# Patient Record
Sex: Male | Born: 1965 | Race: White | Hispanic: No | Marital: Married | Smoking: Light tobacco smoker
Health system: Southern US, Community
[De-identification: ages and names within clinical notes are randomized; demographics above are authoritative.]

## PROBLEM LIST (undated history)

## (undated) DIAGNOSIS — K219 Gastro-esophageal reflux disease without esophagitis: Secondary | ICD-10-CM

## (undated) DIAGNOSIS — T7840XA Allergy, unspecified, initial encounter: Secondary | ICD-10-CM

## (undated) DIAGNOSIS — F419 Anxiety disorder, unspecified: Secondary | ICD-10-CM

## (undated) DIAGNOSIS — E7800 Pure hypercholesterolemia, unspecified: Secondary | ICD-10-CM

## (undated) DIAGNOSIS — G4730 Sleep apnea, unspecified: Secondary | ICD-10-CM

## (undated) HISTORY — DX: Sleep apnea, unspecified: G47.30

## (undated) HISTORY — DX: Anxiety disorder, unspecified: F41.9

## (undated) HISTORY — DX: Pure hypercholesterolemia, unspecified: E78.00

## (undated) HISTORY — DX: Allergy, unspecified, initial encounter: T78.40XA

## (undated) HISTORY — PX: HERNIA REPAIR: SHX51

## (undated) HISTORY — DX: Gastro-esophageal reflux disease without esophagitis: K21.9

## (undated) HISTORY — PX: VASECTOMY: SHX75

---
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
## 1994-01-01 HISTORY — PX: HERNIA REPAIR: SHX51

## 2005-01-01 HISTORY — PX: VASECTOMY: SHX75

## 2005-03-16 ENCOUNTER — Ambulatory Visit (HOSPITAL_BASED_OUTPATIENT_CLINIC_OR_DEPARTMENT_OTHER): Admission: RE | Admit: 2005-03-16 | Discharge: 2005-03-16 | Payer: Self-pay | Admitting: Urology

## 2013-06-04 ENCOUNTER — Encounter (INDEPENDENT_AMBULATORY_CARE_PROVIDER_SITE_OTHER): Payer: Self-pay

## 2013-06-04 ENCOUNTER — Encounter (HOSPITAL_COMMUNITY): Payer: Self-pay | Admitting: Psychiatry

## 2013-06-04 ENCOUNTER — Ambulatory Visit (INDEPENDENT_AMBULATORY_CARE_PROVIDER_SITE_OTHER): Payer: 59 | Admitting: Psychiatry

## 2013-06-04 VITALS — BP 147/108 | HR 75 | Ht 74.0 in | Wt 232.0 lb

## 2013-06-04 DIAGNOSIS — F411 Generalized anxiety disorder: Secondary | ICD-10-CM | POA: Insufficient documentation

## 2013-06-04 MED ORDER — BUPROPION HCL ER (XL) 150 MG PO TB24: 150.0000 mg | ORAL_TABLET | ORAL | Status: DC

## 2013-06-04 NOTE — Progress Notes (Signed)
Psychiatric Assessment Adult  Patient Identification:  Richard ClarksJohn Zamora Date of Evaluation:  06/04/2013 Chief Complaint:  ADD History of Chief Complaint:   Chief Complaint  Patient presents with  . ADD    HPI Comments: Pt reports he thinks he has ADD. Even in school he was a poor student due to poor attention and lack of interest. Pt completed college and had to work hard to finish because his father was dying. Was told in school repeatedly that "Richard RuizJohn has the potential to do great if he would just focus". Later he got married and had a son, was in college and working a full time job and states it all helped him focus and be productive.  States he is easily distracted, starts multiple projects and it takes him months to finish them. While working he has periods of time where he can't take in any other information. During conversations he has trouble staying on topic. Currently at work he forces himself to pay attention to detail and doesn't procrastinate. He keeps a lot of notes and reminders on his calender. Desk is messy. Overall he works hard to be organized and sometimes he is scattered. Denies forgetfulness and losing items. Denies restlessness and figiting. Pt states he learned to be calm and pace himself as a child.   1.5yo ago he reconnected with his biological family and noticed a lot of the same symptoms. Pt spoke with his PCP who recommended he f/up with a psychiatrist for a f/up. States he is comfortable with himself and wonders if should try a stimulant. 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 Feels job performance is good and has never had any performance issues with management.  Overall he is doing well but wonders if he could be better.   Sleeping about 6-7 hrs/night. Energy is on the low side. Appetite is good.     Review of Systems  Constitutional: Negative.   HENT: Negative.   Eyes: Negative.   Respiratory: Negative.   Cardiovascular: Negative.    Gastrointestinal: Positive for abdominal distention.  Genitourinary: Negative.   Musculoskeletal: Negative.   Skin: Negative.   Neurological: Negative.   Psychiatric/Behavioral: Negative.    Physical Exam  Constitutional: He appears well-developed and well-nourished.  Psychiatric: He has a normal mood and affect. His speech is normal and behavior is normal. Judgment and thought content normal. Cognition and memory are normal.    Depressive Symptoms: denies depression, anhedonia, worthlessness/guilt. Denies SI/HI.   (Hypo) Manic Symptoms:   Elevated Mood:  No Irritable Mood:  No Grandiosity:  No Distractibility:  No Labiality of Mood:  No Delusions:  No Hallucinations:  No Impulsivity:  No Sexually Inappropriate Behavior:  No Financial Extravagance:  No Flight of Ideas:  No  Anxiety Symptoms: Excessive Worry:  Overall well controlled with Lexapro. Mom is dying so he is a little preoccupied right now.  Denies insomnia, HA, GI upset Panic Symptoms:  No Agoraphobia:  No Obsessive Compulsive: No  Symptoms: None, Specific Phobias:  No Social Anxiety:  No  Psychotic Symptoms:  Hallucinations: No None Delusions:  No Paranoia:  No   Ideas of Reference:  No  PTSD Symptoms: Ever had a traumatic exposure:  No Had a traumatic exposure in the last month:  No Re-experiencing: No None Hypervigilance:  No Hyperarousal: No None Avoidance: No None  Traumatic Brain Injury: No denies  Past Psychiatric History: Diagnosis: Anxiety  Hospitalizations: denies  Outpatient Care: PCP  Substance Abuse Care:  denies  Self-Mutilation: denies  Suicidal Attempts: denies  Violent Behaviors: denies   Past Medical History:   Past Medical History  Diagnosis Date  . Anxiety   . Hypercholesterolemia   . GERD (gastroesophageal reflux disease)    History of Loss of Consciousness:  No Seizure History:  No Cardiac History:  No Allergies:  No Known Allergies Current Medications:  Current  Outpatient Prescriptions  Medication Sig Dispense Refill  . escitalopram (LEXAPRO) 20 MG tablet Take 20 mg by mouth daily.      . pantoprazole (PROTONIX) 20 MG tablet Take 20 mg by mouth daily.       No current facility-administered medications for this visit.    Previous Psychotropic Medications:  Medication Dose   Lexapro                       Substance Abuse History in the last 12 months: Substance Age of 1st Use Last Use Amount Specific Type  Nicotine  denies        Alcohol  yesterday 1 glass/night wine  Cannabis  denies        Opiates  denies        Cocaine  denies        Methamphetamines  denies        LSD  denies        Ecstasy  denies         Benzodiazepines  denies        Caffeine  denies        Inhalants  denies        Others: denies                         Medical Consequences of Substance Abuse: denies  Legal Consequences of Substance Abuse: denies  Family Consequences of Substance Abuse: denies  Blackouts:  No DT's:  No Withdrawal Symptoms:  No None  Social History: Current Place of Residence: [place], [place] Place of Birth: [place] Family Members: raided by adopted family (parents and grandparents), no siblings Marital Status:  Married Children:3 Relationships: good Education:  Corporate treasurer Problems/Performance: poor Religious Beliefs/Practices: not really History of Abuse: none Occupational Experiences: Fish farm manager History:  None. Legal History: denies Hobbies/Interests: gardening, writing  Family History:   Family History  Problem Relation Age of Onset  . ADD / ADHD Mother   . ADD / ADHD Maternal Aunt     Mental Status Examination/Evaluation: Objective:  Appearance: Neat and Well Groomed  Eye Contact::  Good  Speech:  Clear and Coherent and Normal Rate  Attention: good   Volume:  Normal  Mood:  euthymic  Affect:  Full Range  Thought Process:  Circumstantial  Orientation:  Full (Time, Place, and Person)   Thought Content:  WDL  Suicidal Thoughts:  No  General fund of knowledge: average to above average  Homicidal Thoughts:  No  Judgement:  Good  Insight:  Good  Psychomotor Activity:  Normal  Akathisia:  No  Handed:  Right  AIMS (if indicated):  n/a  Assets:  Communication Skills Desire for Improvement Financial Resources/Insurance Housing Intimacy Leisure Time Physical Health Resilience Social Support Talents/Skills Transportation Vocational/Educational    Laboratory/X-Ray Psychological Evaluation(s)   none on file  denies   Assessment:  GAD. R/o ADD  AXIS I GAD. R/o ADD  AXIS II Deferred  AXIS III Past Medical History  Diagnosis Date  . Anxiety   .  Hypercholesterolemia   . GERD (gastroesophageal reflux disease)      AXIS IV other psychosocial or environmental problems  AXIS V 61-70 mild symptoms   Treatment Plan/Recommendations:  Plan of Care:  Start trial of Wellbutrin to target attention issues.  risks/benefits and SE of the medication discussed. Pt verbalized understanding and verbal consent obtained for treatment.  Affirm with the patient that the medications are taken as ordered. Patient expressed understanding of how their medications were to be used.     Laboratory:  pt will fax over labs from PCP  Psychotherapy: Therapy: brief supportive therapy provided. Discussed psychosocial stressors in detail.     Medications: Continue Lexapro 20mg  po qD for anxiety (pcp will prescribe) Start trial of Wellbutrin XL 150mg  po qD for attention. Pt is aware medication could worsen anxiety. He will monitor and call if unable to tolerate.  Routine PRN Medications:  No  Consultations: encouraged to continue treatment with PCP  Safety Concerns:  Pt denies SI and is at an acute low risk for suicide.Patient told to call clinic if any problems occur. Patient advised to go to ER  if she should develop SI/HI, side effects, or if symptoms worsen. Has crisis numbers to call if  needed. Pt verbalized understanding.   Other: F/up in 3 months or sooner if needed     Oletta Darter, MD 6/4/20159:13 AM

## 2013-09-01 ENCOUNTER — Other Ambulatory Visit (HOSPITAL_COMMUNITY): Payer: Self-pay | Admitting: Psychiatry

## 2013-09-03 ENCOUNTER — Other Ambulatory Visit (HOSPITAL_COMMUNITY): Payer: Self-pay | Admitting: *Deleted

## 2013-09-03 MED ORDER — BUPROPION HCL ER (XL) 150 MG PO TB24: 150.0000 mg | ORAL_TABLET | ORAL | Status: DC

## 2013-09-08 ENCOUNTER — Encounter (HOSPITAL_COMMUNITY): Payer: Self-pay | Admitting: Psychiatry

## 2013-09-08 ENCOUNTER — Ambulatory Visit (INDEPENDENT_AMBULATORY_CARE_PROVIDER_SITE_OTHER): Payer: 59 | Admitting: Psychiatry

## 2013-09-08 VITALS — BP 133/95 | HR 83 | Ht 74.0 in | Wt 230.0 lb

## 2013-09-08 DIAGNOSIS — R41840 Attention and concentration deficit: Secondary | ICD-10-CM

## 2013-09-08 DIAGNOSIS — F411 Generalized anxiety disorder: Secondary | ICD-10-CM

## 2013-09-08 MED ORDER — BUPROPION HCL ER (XL) 300 MG PO TB24: 300.0000 mg | ORAL_TABLET | ORAL | Status: AC

## 2013-09-08 NOTE — Progress Notes (Signed)
[facility] [id number] Progress Note  Richard Zamora [id number] y.o.  09/08/2013 8:35 AM  Chief Complaint: its been a rough few months  History of Present Illness: In Jun 24, 2022 his birth mother passed away of cancer. It was hard but he is doing well. Wife is supportive and no concerns about home life.   States he is stay focused a little better for longer periods of time. At times he notes he is less focused in the morning and evenings. Pt would like to back into exercise in the mornings. No issues at work. No other changes noted from Wellbutrin. Denies SE.   States anxiety is manageable with Celexa.   Suicidal Ideation: No Plan Formed: No Patient has means to carry out plan: No  Homicidal Ideation: No Plan Formed: No Patient has means to carry out plan: No  Review of Systems: Psychiatric: Agitation: No Hallucination: No Depressed Mood: No Insomnia: Yes Hypersomnia: No Altered Concentration: Yes Feels Worthless: No Grandiose Ideas: No Belief In Special Powers: No New/Increased Substance Abuse: No Compulsions: No  Neurologic: Headache: No Seizure: No Paresthesias: No  Past Medical Family, Social History: Pt lives in [place] with his wife. He has 3 children. He works as a Clinical biochemist. Pt was raised by his adoptive family and does not have any siblings. Pt reports that he has never smoked. He has never used smokeless tobacco. He reports that he drinks about 4.2 ounces of alcohol per week. He reports that he does not use illicit drugs.  Family History  Problem Relation Age of Onset  . ADD / ADHD Mother   . ADD / ADHD Maternal Aunt   . Suicidality Neg Hx    Past Medical History  Diagnosis Date  . Anxiety   . Hypercholesterolemia   . GERD (gastroesophageal reflux disease)    Outpatient Encounter Prescriptions as of 09/08/2013  Medication Sig  . buPROPion (WELLBUTRIN XL) 150 MG 24 hr tablet Take 1 tablet (150 mg total) by mouth every morning.  .  escitalopram (LEXAPRO) 20 MG tablet Take 20 mg by mouth daily.  . pantoprazole (PROTONIX) 20 MG tablet Take 20 mg by mouth daily.    Past Psychiatric History/Hospitalization(s): Anxiety: Yes Bipolar Disorder: No Depression: No Mania: No Psychosis: No Schizophrenia: No Personality Disorder: No Hospitalization for psychiatric illness: No History of Electroconvulsive Shock Therapy: No Prior Suicide Attempts: No  Physical Exam: Constitutional:  BP 133/95  Pulse 83  Ht  (1.88 m)  Wt 230 lb (104.327 kg)  BMI 29.52 kg/m2  General Appearance: alert, oriented, no acute distress and well nourished  Musculoskeletal: Strength & Muscle Tone: within normal limits Gait & Station: normal Patient leans: N/A  Mental Status Examination/Evaluation: Objective: Attitude: Calm and cooperative  Appearance: Fairly Groomed, appears to be stated age  Eye Contact::  Good  Speech:  Clear and Coherent and Normal Rate  Volume:  Normal  Mood:  euthymic  Affect:  Full Range  Thought Process:  Goal Directed, Intact and Linear  Orientation:  Full (Time, Place, and Person)  Thought Content:  Negative  Suicidal Thoughts:  No  Homicidal Thoughts:  No  Judgement:  Good  Insight:  Good  Concentration: good  Memory: Immediate-good Recent-good Remote-good  Recall: fair  Language: fair  Gait and Station: normal  [employer] of Knowledge: average  Psychomotor Activity:  Normal  Akathisia:  No  Handed:  Right  AIMS (if indicated): n/a   Assets:  Communication Skills Desire for Improvement Financial Resources/Insurance  Housing Intimacy Leisure Time Physical Health Resilience Social Support Location manager (Choose Three): Established Problem, Stable/Improving (1), Review of Psycho-Social Stressors (1), Review or order clinical lab tests (1), Review of Medication Regimen & Side Effects (2) and Review of New  Medication or Change in Dosage (2)  Assessment: AXIS I  GAD. R/o ADD   AXIS II  Deferred   AXIS III  Past Medical History    Diagnosis  Date    .  Anxiety     .  Hypercholesterolemia     .  GERD (gastroesophageal reflux disease)    AXIS IV  other psychosocial or environmental problems   AXIS V  61-70 mild symptoms      Plan: Plan of Care:  Medication management with supportive therapy. Risks/benefits and SE of the medication discussed. Pt verbalized understanding and verbal consent obtained for treatment.  Affirm with the patient that the medications are taken as ordered. Patient expressed understanding of how their medications were to be used.  -mild  improvement of symptoms  Laboratory: pt will gets labs done in few weeks  Psychotherapy: Therapy: brief supportive therapy provided. Discussed psychosocial  stressors in detail.   Medications: Continue Lexapro  po qD for anxiety (pcp will prescribe)  Increase Wellbutrin XL  po qD for attention. Pt is aware medication could worsen anxiety temporarily. He will monitor and call if unable to tolerate.   Routine PRN Medications: No   Consultations: encouraged to continue treatment with PCP   Safety Concerns: Pt denies SI and is at an acute low risk for suicide.Patient told to call clinic if any problems occur. Patient advised to go to ER if she should develop SI/HI, side effects, or if symptoms worsen. Has crisis numbers to call if needed. Pt verbalized understanding.   Other: F/up in 4 months or sooner if needed    Oletta Darter, MD 09/08/2013

## 2017-08-05 ENCOUNTER — Emergency Department (HOSPITAL_BASED_OUTPATIENT_CLINIC_OR_DEPARTMENT_OTHER)
Admission: EM | Admit: 2017-08-05 | Discharge: 2017-08-05 | Disposition: A | Discharge status: Home / Self Care | Payer: BLUE CROSS/BLUE SHIELD | Attending: Emergency Medicine | Admitting: Emergency Medicine

## 2017-08-05 ENCOUNTER — Emergency Department (HOSPITAL_BASED_OUTPATIENT_CLINIC_OR_DEPARTMENT_OTHER): Payer: BLUE CROSS/BLUE SHIELD

## 2017-08-05 ENCOUNTER — Other Ambulatory Visit: Payer: Self-pay

## 2017-08-05 ENCOUNTER — Encounter (HOSPITAL_BASED_OUTPATIENT_CLINIC_OR_DEPARTMENT_OTHER): Payer: Self-pay

## 2017-08-05 DIAGNOSIS — K5792 Diverticulitis of intestine, part unspecified, without perforation or abscess without bleeding: Secondary | ICD-10-CM

## 2017-08-05 DIAGNOSIS — F419 Anxiety disorder, unspecified: Secondary | ICD-10-CM | POA: Insufficient documentation

## 2017-08-05 DIAGNOSIS — Z79899 Other long term (current) drug therapy: Secondary | ICD-10-CM | POA: Diagnosis not present

## 2017-08-05 DIAGNOSIS — R1030 Lower abdominal pain, unspecified: Secondary | ICD-10-CM | POA: Diagnosis present

## 2017-08-05 DIAGNOSIS — K5732 Diverticulitis of large intestine without perforation or abscess without bleeding: Secondary | ICD-10-CM | POA: Insufficient documentation

## 2017-08-05 LAB — URINALYSIS, ROUTINE W REFLEX MICROSCOPIC
Bilirubin Urine: NEGATIVE
Glucose, UA: NEGATIVE mg/dL
HGB URINE DIPSTICK: NEGATIVE
Ketones, ur: NEGATIVE mg/dL
Leukocytes, UA: NEGATIVE
NITRITE: NEGATIVE
Protein, ur: NEGATIVE mg/dL
SPECIFIC GRAVITY, URINE: 1.01 (ref 1.005–1.030)
pH: 6 (ref 5.0–8.0)

## 2017-08-05 LAB — COMPREHENSIVE METABOLIC PANEL
ALK PHOS: 51 U/L (ref 38–126)
ALT: 22 U/L (ref 0–44)
ANION GAP: 9 (ref 5–15)
AST: 21 U/L (ref 15–41)
Albumin: 3.8 g/dL (ref 3.5–5.0)
BUN: 10 mg/dL (ref 6–20)
CO2: 26 mmol/L (ref 22–32)
CREATININE: 1.13 mg/dL (ref 0.61–1.24)
Calcium: 8.9 mg/dL (ref 8.9–10.3)
Chloride: 102 mmol/L (ref 98–111)
GFR calc non Af Amer: >60 mL/min (ref >60)
Glucose, Bld: 103 mg/dL — ABNORMAL HIGH (ref 70–99)
Potassium: 4.4 mmol/L (ref 3.5–5.1)
Sodium: 137 mmol/L (ref 135–145)
TOTAL PROTEIN: 7.6 g/dL (ref 6.5–8.1)
Total Bilirubin: 0.6 mg/dL (ref 0.3–1.2)

## 2017-08-05 LAB — CBC WITH DIFFERENTIAL/PLATELET
BASOS PCT: 0 %
Basophils Absolute: 0 10*3/uL (ref 0.0–0.1)
EOS ABS: 0.1 10*3/uL (ref 0.0–0.7)
EOS PCT: 2 %
HCT: 48.4 % (ref 39.0–52.0)
HEMOGLOBIN: 16.6 g/dL (ref 13.0–17.0)
LYMPHS ABS: 1.2 10*3/uL (ref 0.7–4.0)
Lymphocytes Relative: 15 %
MCH: 31.3 pg (ref 26.0–34.0)
MCHC: 34.3 g/dL (ref 30.0–36.0)
MCV: 91.1 fL (ref 78.0–100.0)
MONO ABS: 0.9 10*3/uL (ref 0.1–1.0)
MONOS PCT: 11 %
NEUTROS PCT: 72 %
Neutro Abs: 6.1 10*3/uL (ref 1.7–7.7)
PLATELETS: 242 10*3/uL (ref 150–400)
RBC: 5.31 MIL/uL (ref 4.22–5.81)
RDW: 12.7 % (ref 11.5–15.5)
WBC: 8.4 10*3/uL (ref 4.0–10.5)

## 2017-08-05 LAB — LIPASE, BLOOD: Lipase: 36 U/L (ref 11–51)

## 2017-08-05 MED ORDER — CIPROFLOXACIN HCL 500 MG PO TABS
500.0000 mg | ORAL_TABLET | Freq: Once | ORAL | Status: AC
  Administered 2017-08-05: 500 mg via ORAL
  Filled 2017-08-05: qty 1

## 2017-08-05 MED ORDER — METRONIDAZOLE 500 MG PO TABS
500.0000 mg | ORAL_TABLET | Freq: Once | ORAL | Status: AC
  Administered 2017-08-05: 500 mg via ORAL
  Filled 2017-08-05: qty 1

## 2017-08-05 MED ORDER — IOPAMIDOL (ISOVUE-300) INJECTION 61%
100.0000 mL | Freq: Once | INTRAVENOUS | Status: AC | PRN
  Administered 2017-08-05: 100 mL via INTRAVENOUS

## 2017-08-05 MED ORDER — SODIUM CHLORIDE 0.9 % IV SOLN
INTRAVENOUS | Status: DC
  Administered 2017-08-05: 14:00:00 via INTRAVENOUS

## 2017-08-05 MED ORDER — METRONIDAZOLE 500 MG PO TABS: 500.0000 mg | ORAL_TABLET | Freq: Two times a day (BID) | ORAL | 0 refills | Status: DC

## 2017-08-05 MED ORDER — IOPAMIDOL (ISOVUE-300) INJECTION 61%
30.0000 mL | Freq: Once | INTRAVENOUS | Status: AC | PRN
  Administered 2017-08-05: 15 mL via ORAL

## 2017-08-05 MED ORDER — CIPROFLOXACIN HCL 500 MG PO TABS: 500.0000 mg | ORAL_TABLET | Freq: Two times a day (BID) | ORAL | 0 refills | Status: DC

## 2017-08-05 MED FILL — CIPROFLOXACIN HCL 500 MG TA: 500 | 7 days supply | Qty: 14 | Fill #0

## 2017-08-05 MED FILL — metroNIDAZOLE 500 MG TABS: 500 | 7 days supply | Qty: 14 | Fill #0

## 2017-08-05 NOTE — Discharge Instructions (Signed)
After symptoms resolve he can increase the fiber in your diet.  Eat a very bland diet until the pain improves and then he can go back to your normal diet.  Return if you develop persistent fever, severe pain or vomiting and unable to keep her antibiotics down.

## 2017-08-05 NOTE — ED Triage Notes (Signed)
C/o lower abd pain day x 2 days-NAD-steady gait

## 2017-08-05 NOTE — ED Provider Notes (Signed)
[facility] EMERGENCY DEPARTMENT Provider Note   CSN: [id number] Arrival date & time: 08/05/17  1258     History   Chief Complaint Chief Complaint  Patient presents with  . Abdominal Pain    HPI Richard Zamora is a 52 y.o. male.  The history is provided by the patient.  Abdominal Pain   This is a new problem. The current episode started 2 days ago. The problem occurs constantly. The problem has been gradually worsening. The pain is associated with an unknown factor. The pain is located in the suprapubic region, RLQ and LLQ. The quality of the pain is aching, colicky, cramping and shooting. The pain is at a severity of 4/10. The pain is moderate. Associated symptoms include fever. Pertinent negatives include anorexia, diarrhea, nausea, vomiting, constipation, dysuria and frequency. Associated symptoms comments: Normal BM today. The symptoms are aggravated by eating (movement). Nothing relieves the symptoms. Past medical history comments: hx of l inguinal hernia repair 20 years ago but no other abd surgery.    Past Medical History:  Diagnosis Date  . Anxiety   . GERD (gastroesophageal reflux disease)   . Hypercholesterolemia     Patient Active Problem List   Diagnosis Date Noted  . GAD (generalized anxiety disorder) 06/04/2013    Past Surgical History:  Procedure Laterality Date  . HERNIA REPAIR    . VASECTOMY          Home Medications    Prior to Admission medications   Medication Sig Start Date End Date Taking? Authorizing Provider  buPROPion (WELLBUTRIN XL) 300 MG 24 hr tablet Take 1 tablet (300 mg total) by mouth every morning. 09/08/13 08/05/17 Yes Oletta DarterAgarwal, [place], MD  escitalopram (LEXAPRO) 20 MG tablet Take 20 mg by mouth daily.   Yes [provider]  pantoprazole (PROTONIX) 20 MG tablet Take 20 mg by mouth daily.   Yes [provider]    Family History Family History  Problem Relation Age of Onset  . ADD / ADHD Mother   . ADD / ADHD  Maternal Aunt   . Suicidality Neg Hx     Social History Social History   Tobacco Use  . Smoking status: Never Smoker  . Smokeless tobacco: Never Used  Substance Use Topics  . Alcohol use: Yes    Comment: occ  . Drug use: No     Allergies   Patient has no known allergies.   Review of Systems Review of Systems  Constitutional: Positive for fever.  Gastrointestinal: Positive for abdominal pain. Negative for anorexia, constipation, diarrhea, nausea and vomiting.  Genitourinary: Negative for dysuria and frequency.  All other systems reviewed and are negative.    Physical Exam Updated Vital Signs BP (!) 129/97 (BP Location: Left Arm)   Pulse 100   Temp 98.8 F (37.1 C) (Oral)   Resp 16   Ht 6\' 2"  (1.88 m)   Wt 108 kg (238 lb)   SpO2 96%   BMI 30.56 kg/m   Physical Exam  Constitutional: He is oriented to person, place, and time. He appears well-developed and well-nourished. No distress.  HENT:  Head: Normocephalic and atraumatic.  Mouth/Throat: Oropharynx is clear and moist.  Eyes: Pupils are equal, round, and reactive to light. Conjunctivae and EOM are normal.  Neck: Normal range of motion. Neck supple.  Cardiovascular: Normal rate, regular rhythm and intact distal pulses.  No murmur heard. Pulmonary/Chest: Effort normal and breath sounds normal. No respiratory distress. He has no wheezes. He has  no rales.  Abdominal: Soft. Bowel sounds are normal. He exhibits no distension. There is tenderness in the right lower quadrant and suprapubic area. There is guarding. There is no rebound and no CVA tenderness. No hernia.  Musculoskeletal: Normal range of motion. He exhibits no edema or tenderness.  Neurological: He is alert and oriented to person, place, and time.  Skin: Skin is warm and dry. Capillary refill takes less than 2 seconds. No rash noted. No erythema.  Psychiatric: He has a normal mood and affect. His behavior is normal.  Nursing note and vitals  reviewed.    ED Treatments / Results  Labs (all labs ordered are listed, but only abnormal results are displayed) Labs Reviewed  COMPREHENSIVE METABOLIC PANEL - Abnormal; Notable for the following components:      Result Value   Glucose, Bld 103 (*)    All other components within normal limits  CBC WITH DIFFERENTIAL/PLATELET  LIPASE, BLOOD  URINALYSIS, ROUTINE W REFLEX MICROSCOPIC    EKG None  Radiology Ct Abdomen Pelvis W Contrast  Result Date: 08/05/2017 CLINICAL DATA:  Lower abdominal pain and pressure for 2 days EXAM: CT ABDOMEN AND PELVIS WITH CONTRAST TECHNIQUE: Multidetector CT imaging of the abdomen and pelvis was performed using the standard protocol following bolus administration of intravenous contrast. CONTRAST:  100 mL Isovue 300 COMPARISON:  None. FINDINGS: Lower chest: No acute abnormality. Hepatobiliary: No focal liver abnormality is seen. No gallstones, gallbladder wall thickening, or biliary dilatation. Pancreas: Unremarkable. No pancreatic ductal dilatation or surrounding inflammatory changes. Spleen: Normal in size without focal abnormality. Adrenals/Urinary Tract: Adrenal glands are within normal limits bilaterally. Kidneys are well visualized bilaterally within normal enhancement pattern. Delayed images demonstrate normal renal excretion. The bladder is decompressed. Stomach/Bowel: Changes of diverticulitis are identified within the sigmoid colon with considerable pericolonic inflammatory change. No definitive abscess or perforation is noted at this time. No obstructive changes are seen. The appendix is not well visualized although no inflammatory changes are seen. No small bowel or stomach abnormality is noted. Vascular/Lymphatic: No significant vascular findings are present. No enlarged abdominal or pelvic lymph nodes. Reproductive: Prostate is unremarkable. Other: No abdominal wall hernia or abnormality. No abdominopelvic ascites. Musculoskeletal: No acute or  significant osseous findings. IMPRESSION: Sigmoid diverticulitis without perforation or abscess formation. Electronically Signed   By: Alcide Clever M.D.   On: 08/05/2017 15:07    Procedures Procedures (including critical care time)  Medications Ordered in ED Medications  0.9 %  sodium chloride infusion (has no administration in time range)     Initial Impression / Assessment and Plan / ED Course  I have reviewed the triage vital signs and the nursing notes.  Pertinent labs & imaging results that were available during my care of the patient were reviewed by me and considered in my medical decision making (see chart for details).    Patient presenting today with 2 days of worsening lower abdominal pain.  He does have guarding and rebound.  Pain is most pronounced in the right lower quadrant but also has some left lower quadrant pain.  Concern for appendicitis versus diverticulitis.  Did have a fever yesterday but none today.  Well-appearing on exam.  Patient last ate around 10:00 this morning.  He is not on anticoagulation and has no significant past medical history.  CBC, CMP, lipase, UA, CT of the abdomen and pelvis pending.  Patient started on maintenance fluids.  He is refusing pain medication at this time.  3:22 PM Labs are  within normal limits.  CT is consistent with uncomplicated sigmoid diverticulitis.  Patient will be given oral antibiotics and discussed strict return precautions as well as dietary changes while having pain.  He will follow-up with GI for a colonoscopy in the future.   Final Clinical Impressions(s) / ED Diagnoses   Final diagnoses:  Diverticulitis    ED Discharge Orders        Ordered    ciprofloxacin (CIPRO) 500 MG tablet  2 times daily     08/05/17 1520    metroNIDAZOLE (FLAGYL) 500 MG tablet  2 times daily     08/05/17 1520       Gwyneth Sprout, MD 08/05/17 1523

## 2019-02-16 IMAGING — CT CT ABD-PELV W/ CM
2 of 5 series · 16 of 46 positions shown, 18 images · IV contrast (isovue)
Comparison: None.

CLINICAL DATA: Lower abdominal pain and pressure for 2 days

EXAM:
CT ABDOMEN AND PELVIS WITH CONTRAST
TECHNIQUE: Multidetector CT imaging of the abdomen and pelvis was performed
using the standard protocol following bolus administration of
intravenous contrast.
CONTRAST:  100 mL Isovue 300

[Series 2: axial st · axial · 0.98mm/px · z∈[-620,-60]mm · 13 of 124 slices shown, 15 images]
[im 6/124  soft-tissue]
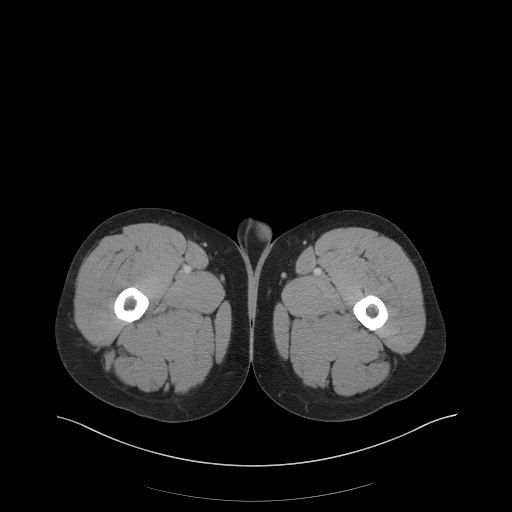
[im 6/124  bone]
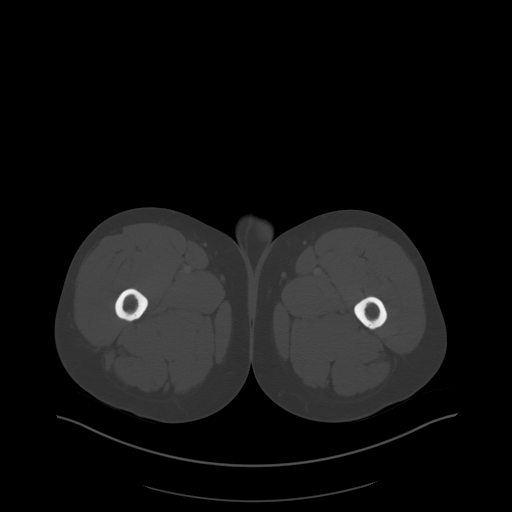
[im 18/124  soft-tissue]
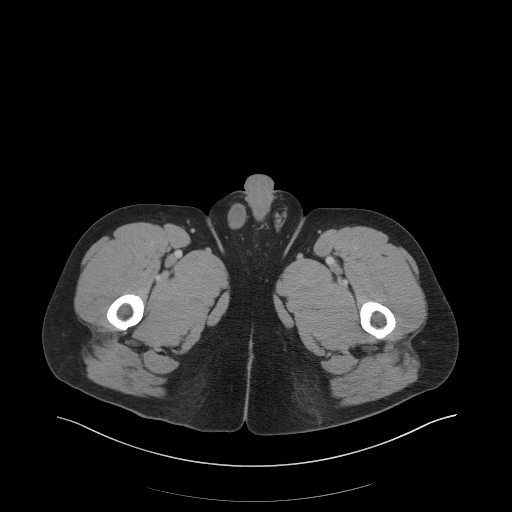
[im 24/124  soft-tissue]
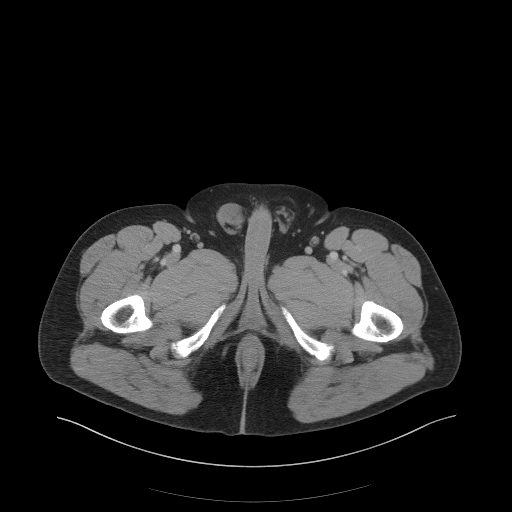
[im 36/124  soft-tissue]
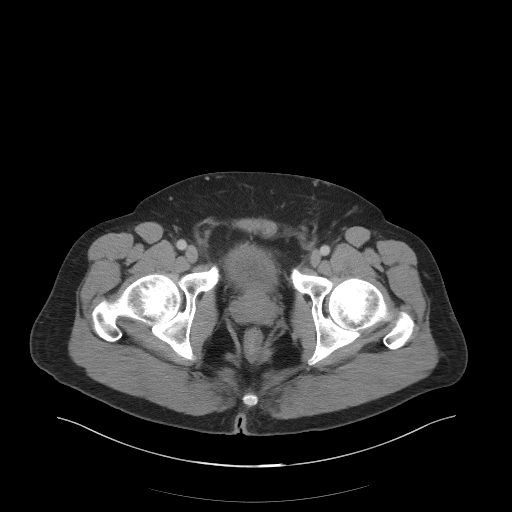
[im 42/124  soft-tissue]
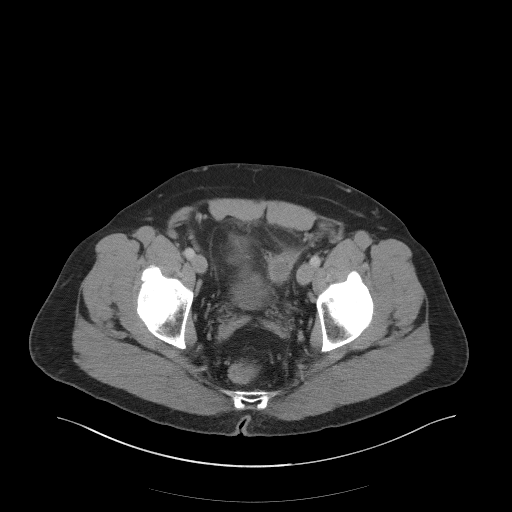
[im 53/124  soft-tissue]
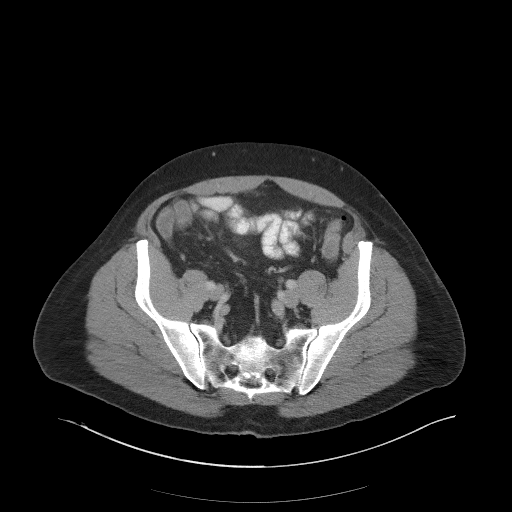
[im 65/124  soft-tissue]
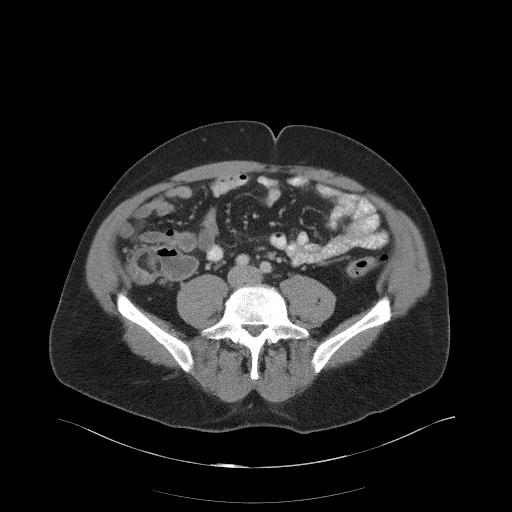
[im 71/124  soft-tissue]
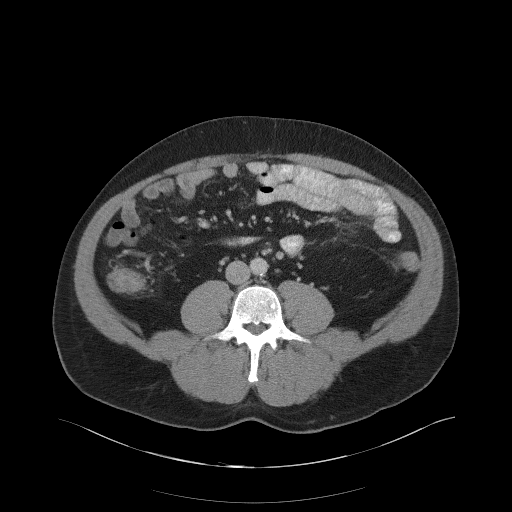
[im 83/124  soft-tissue]
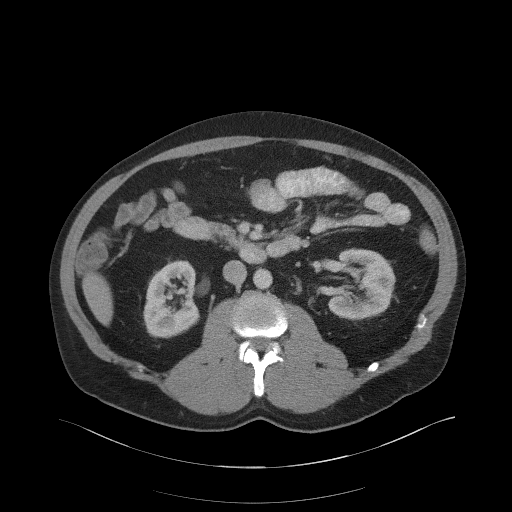
[im 83/124  bone]
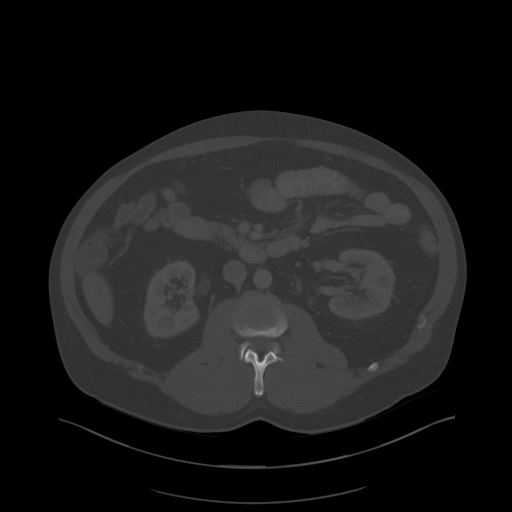
[im 88/124  soft-tissue]
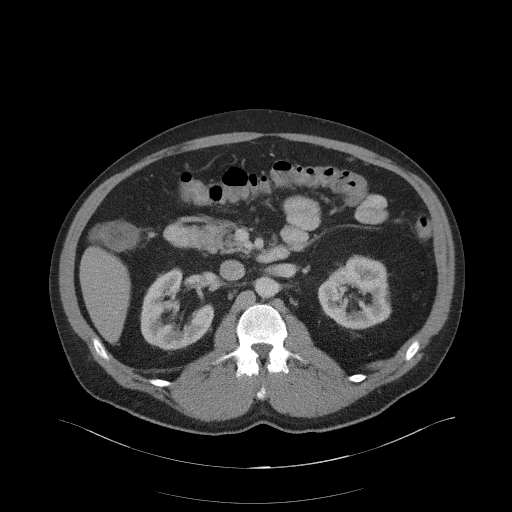
[im 100/124  soft-tissue]
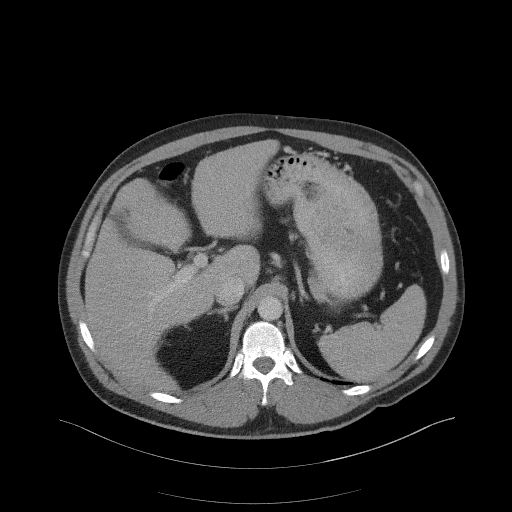
[im 106/124  soft-tissue]
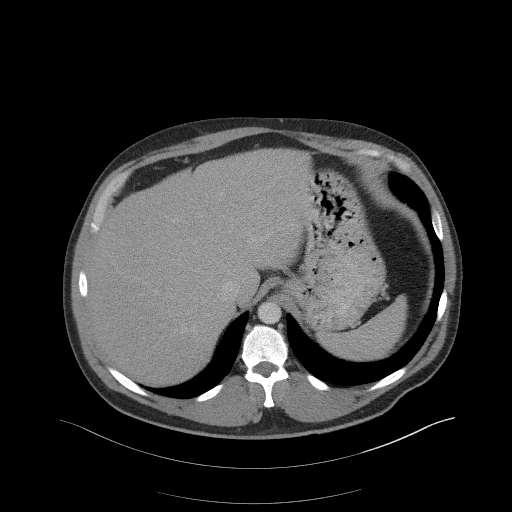
[im 118/124  soft-tissue]
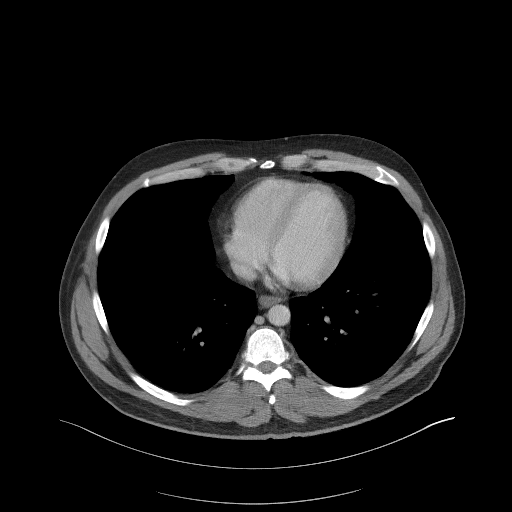

[Series 5: coronal st · coronal · 0.89mm/px · 3 of 109 slices shown]
[im 37/109  soft-tissue]
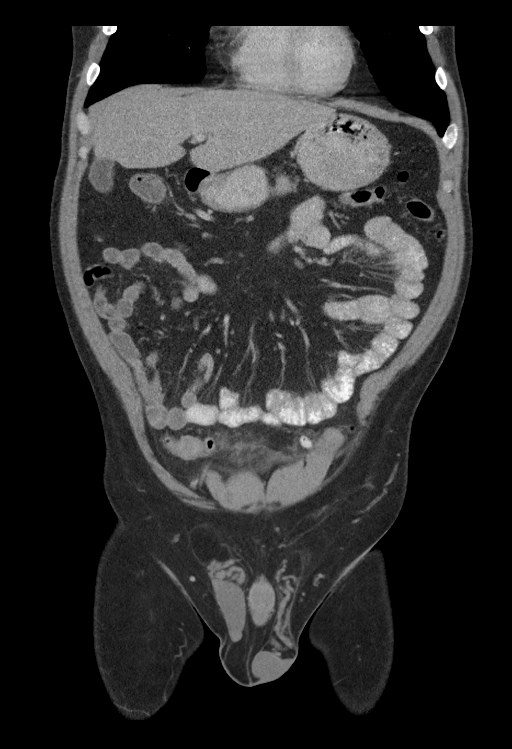
[im 49/109  soft-tissue]
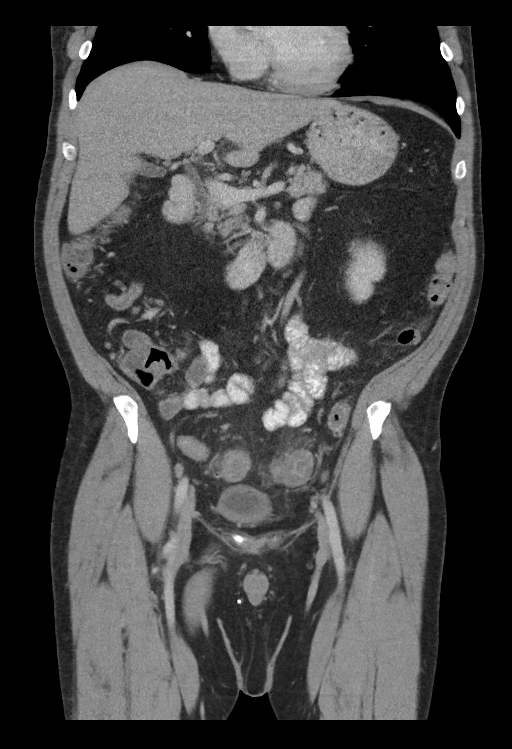
[im 61/109  soft-tissue]
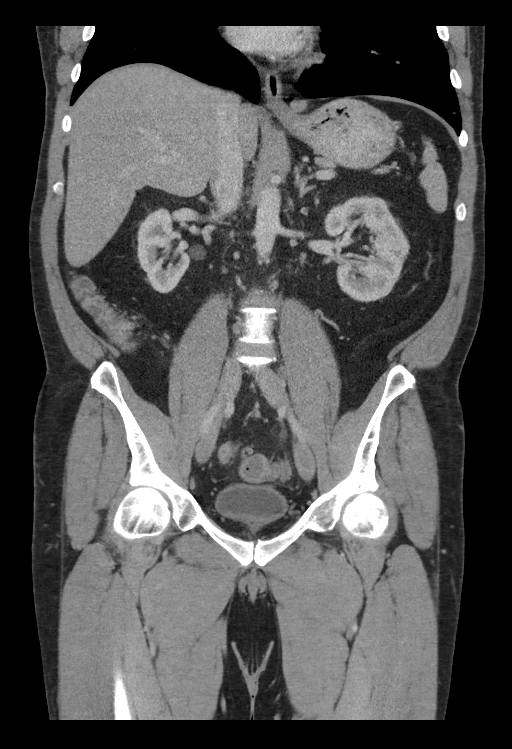

[16 of 46 positions shown; findings below may reference images not displayed]

FINDINGS: Lower chest: No acute abnormality.

Hepatobiliary: No focal liver abnormality is seen. No gallstones,
gallbladder wall thickening, or biliary dilatation.

Pancreas: Unremarkable. No pancreatic ductal dilatation or
surrounding inflammatory changes.

Spleen: Normal in size without focal abnormality.

Adrenals/Urinary Tract: Adrenal glands are within normal limits
bilaterally. Kidneys are well visualized bilaterally within normal
enhancement pattern. Delayed images demonstrate normal renal
excretion. The bladder is decompressed.

Stomach/Bowel: Changes of diverticulitis are identified within the
sigmoid colon with considerable pericolonic inflammatory change. No
definitive abscess or perforation is noted at this time. No
obstructive changes are seen. The appendix is not well visualized
although no inflammatory changes are seen. No small bowel or stomach
abnormality is noted.

Vascular/Lymphatic: No significant vascular findings are present. No
enlarged abdominal or pelvic lymph nodes.

Reproductive: Prostate is unremarkable.

Other: No abdominal wall hernia or abnormality. No abdominopelvic
ascites.

Musculoskeletal: No acute or significant osseous findings.
IMPRESSION: Sigmoid diverticulitis without perforation or abscess formation.

## 2019-08-13 ENCOUNTER — Encounter: Payer: Self-pay | Admitting: Gastroenterology

## 2019-10-08 ENCOUNTER — Telehealth: Payer: Self-pay | Admitting: *Deleted

## 2019-10-08 NOTE — Telephone Encounter (Signed)
Patient called back and reschedule PV.

## 2019-10-08 NOTE — Telephone Encounter (Signed)
Patient no showed PV today- Called patient and left message to return call by 5 pm today- If no call by 5 pm, PV and procedure will be canceled -  

## 2019-10-14 ENCOUNTER — Other Ambulatory Visit: Payer: Self-pay

## 2019-10-14 ENCOUNTER — Ambulatory Visit (AMBULATORY_SURGERY_CENTER): Payer: Self-pay

## 2019-10-14 ENCOUNTER — Encounter: Payer: Self-pay | Admitting: Gastroenterology

## 2019-10-14 VITALS — Ht 74.0 in | Wt 259.0 lb

## 2019-10-14 DIAGNOSIS — Z1211 Encounter for screening for malignant neoplasm of colon: Secondary | ICD-10-CM

## 2019-10-14 MED ORDER — SUTAB 1479-225-188 MG PO TABS: 1.0000 | ORAL_TABLET | ORAL | 0 refills | Status: DC

## 2019-10-14 NOTE — Progress Notes (Signed)
No egg or soy allergy known to patient  No issues with past sedation with any surgeries or procedures no intubation problems in the past  No FH of Malignant Hyperthermia No diet pills per patient No home 02 use per patient  No blood thinners per patient  Pt denies issues with constipation  No A fib or A flutter  COVID 19 guidelines implemented in [facility] today with Pt and RN  Coupon given to pt in [facility] today , Code to Pharmacy  COVID vaccines completed on 04/2019 per pt;  Due to the COVID-19 pandemic we are asking patients to follow these guidelines. Please only bring one care partner. Please be aware that your care partner may wait in the car in the parking lot or if they feel like they will be too hot to wait in the car, they may wait in the lobby on the 4th floor. All care partners are required to wear a mask the entire time (we do not have any that we can provide them), they need to practice social distancing, and we will do a Covid check for all patient's and care partners when you arrive. Also we will check their temperature and your temperature. If the care partner waits in their car they need to stay in the parking lot the entire time and we will call them on their cell phone when the patient is ready for discharge so they can bring the car to the front of the building. Also all patient's will need to wear a mask into building.

## 2019-10-22 ENCOUNTER — Ambulatory Visit (AMBULATORY_SURGERY_CENTER): Payer: BC Managed Care – PPO | Admitting: Gastroenterology

## 2019-10-22 ENCOUNTER — Encounter: Payer: Self-pay | Admitting: Gastroenterology

## 2019-10-22 ENCOUNTER — Other Ambulatory Visit: Payer: Self-pay

## 2019-10-22 VITALS — BP 146/88 | HR 75 | Temp 97.8°F | Resp 18 | Ht 74.0 in | Wt 259.0 lb

## 2019-10-22 DIAGNOSIS — Z1211 Encounter for screening for malignant neoplasm of colon: Secondary | ICD-10-CM

## 2019-10-22 DIAGNOSIS — R197 Diarrhea, unspecified: Secondary | ICD-10-CM | POA: Diagnosis present

## 2019-10-22 DIAGNOSIS — K5730 Diverticulosis of large intestine without perforation or abscess without bleeding: Secondary | ICD-10-CM | POA: Diagnosis not present

## 2019-10-22 DIAGNOSIS — K648 Other hemorrhoids: Secondary | ICD-10-CM | POA: Diagnosis not present

## 2019-10-22 MED ORDER — SODIUM CHLORIDE 0.9 % IV SOLN: 500.0000 mL | Freq: Once | INTRAVENOUS | Status: DC

## 2019-10-22 NOTE — Progress Notes (Signed)
VS-CW  Pt's states no medical or surgical changes since previsit or office visit.  

## 2019-10-22 NOTE — Progress Notes (Signed)
Called to room to assist during endoscopic procedure.  Patient ID and intended procedure confirmed with present staff. Received instructions for my participation in the procedure from the performing physician.  

## 2019-10-22 NOTE — Progress Notes (Signed)
A/ox3, pleased with MAC, report to RN 

## 2019-10-22 NOTE — Op Note (Signed)
[facility] Patient Name: Richard Zamora Procedure Date: 10/22/2019 8:31 AM MRN: [id number] Endoscopist: Viviann Spare P. Adela Lank , MD Age: 54 Referring MD:  Date of Birth: 12-21-1965 Gender: Male Account #: [id number] Procedure:                Colonoscopy Indications:              Screening for colorectal malignant neoplasm, This                            is the patient's first colonoscopy, patient                            incidentally endorses chronic intermittent loose                            stools Medicines:                Monitored Anesthesia Care Procedure:                Pre-Anesthesia Assessment:                           - Prior to the procedure, a History and Physical                            was performed, and patient medications and                            allergies were reviewed. The patient's tolerance of                            previous anesthesia was also reviewed. The risks                            and benefits of the procedure and the sedation                            options and risks were discussed with the patient.                            All questions were answered, and informed consent                            was obtained. Prior Anticoagulants: The patient has                            taken no previous anticoagulant or antiplatelet                            agents. ASA Grade Assessment: III - A patient with                            severe systemic disease. After reviewing the risks  and benefits, the patient was deemed in                            satisfactory condition to undergo the procedure.                           After obtaining informed consent, the colonoscope                            was passed under direct vision. Throughout the                            procedure, the patient's blood pressure, pulse, and                            oxygen saturations were monitored continuously. The                             Colonoscope was introduced through the anus and                            advanced to the the terminal ileum, with                            identification of the appendiceal orifice and IC                            valve. The colonoscopy was performed without                            difficulty. The patient tolerated the procedure                            well. The quality of the bowel preparation was                            good. The terminal ileum, ileocecal valve,                            appendiceal orifice, and rectum were photographed. Scope In: 8:46:24 AM Scope Out: 9:09:10 AM Scope Withdrawal Time: 0 hours 19 minutes 44 seconds  Total Procedure Duration: 0 hours 22 minutes 46 seconds  Findings:                 The perianal and digital rectal examinations were                            normal.                           The terminal ileum appeared normal.                           Multiple medium-mouthed diverticula were found in  the sigmoid colon.                           Internal hemorrhoids were found during                            retroflexion. The hemorrhoids were moderate.                           The exam was otherwise without abnormality. Poor                            air retention in the left colon prolonged the exam.                           Biopsies for histology were taken with a cold                            forceps from the right colon, left colon and                            transverse colon for evaluation of microscopic                            colitis. Complications:            No immediate complications. Estimated blood loss:                            Minimal. Estimated Blood Loss:     Estimated blood loss was minimal. Impression:               - The examined portion of the ileum was normal.                           - Diverticulosis in the sigmoid colon.                           -  Internal hemorrhoids.                           - The examination was otherwise normal.                           - Biopsies were taken with a cold forceps from the                            right colon, left colon and transverse colon for                            evaluation of microscopic colitis. Recommendation:           - Patient has a contact number available for                            emergencies. The signs and symptoms of potential  delayed complications were discussed with the                            patient. Return to normal activities tomorrow.                            Written discharge instructions were provided to the                            patient.                           - Resume previous diet.                           - Continue present medications.                           - Await pathology results.                           - Repeat colonoscopy in 10 years for screening                            purposes Willaim Rayas. Ashtan Girtman, MD 10/22/2019 9:12:31 AM This report has been signed electronically.

## 2019-10-22 NOTE — Patient Instructions (Signed)
Please read handouts provided. Continue present medications. Await pathology results.   YOU HAD AN ENDOSCOPIC PROCEDURE TODAY AT THE [facility]:   Refer to the procedure report that was given to you for any specific questions about what was found during the examination.  If the procedure report does not answer your questions, please call your gastroenterologist to clarify.  If you requested that your care partner not be given the details of your procedure findings, then the procedure report has been included in a sealed envelope for you to review at your convenience later.  YOU SHOULD EXPECT: Some feelings of bloating in the abdomen. Passage of more gas than usual.  Walking can help get rid of the air that was put into your GI tract during the procedure and reduce the bloating. If you had a lower endoscopy (such as a colonoscopy or flexible sigmoidoscopy) you may notice spotting of blood in your stool or on the toilet paper. If you underwent a bowel prep for your procedure, you may not have a normal bowel movement for a few days.  Please Note:  You might notice some irritation and congestion in your nose or some drainage.  This is from the oxygen used during your procedure.  There is no need for concern and it should clear up in a day or so.  SYMPTOMS TO REPORT IMMEDIATELY:  Following lower endoscopy (colonoscopy or flexible sigmoidoscopy):  Excessive amounts of blood in the stool  Significant tenderness or worsening of abdominal pains  Swelling of the abdomen that is new, acute  Fever of 100F or higher   For urgent or emergent issues, a gastroenterologist can be reached at any hour by calling [phone]. Do not use MyChart messaging for urgent concerns.    DIET:  We do recommend a small meal at first, but then you may proceed to your regular diet.  Drink plenty of fluids but you should avoid alcoholic beverages for 24 hours.  ACTIVITY:  You should plan to take it easy  for the rest of today and you should NOT DRIVE or use heavy machinery until tomorrow (because of the sedation medicines used during the test).    FOLLOW UP: Our staff will call the number listed on your records 48-72 hours following your procedure to check on you and address any questions or concerns that you may have regarding the information given to you following your procedure. If we do not reach you, we will leave a message.  We will attempt to reach you two times.  During this call, we will ask if you have developed any symptoms of COVID 19. If you develop any symptoms (ie: fever, flu-like symptoms, shortness of breath, cough etc.) before then, please call [phone].  If you test positive for Covid 19 in the 2 weeks post procedure, please call and report this information to us.    If any biopsies were taken you will be contacted by phone or by letter within the next 1-3 weeks.  Please call us at [phone] if you have not heard about the biopsies in 3 weeks.    SIGNATURES/CONFIDENTIALITY: You and/or your care partner have signed paperwork which will be entered into your electronic medical record.  These signatures attest to the fact that that the information above on your After Visit Summary has been reviewed and is understood.  Full responsibility of the confidentiality of this discharge information lies with you and/or your care-partner.  

## 2019-10-26 ENCOUNTER — Telehealth: Payer: Self-pay

## 2019-10-26 ENCOUNTER — Telehealth: Payer: Self-pay | Admitting: *Deleted

## 2019-10-26 NOTE — Telephone Encounter (Signed)
NO ANSWER, MESSAGE LEFT FOR PATIENT. 

## 2019-10-26 NOTE — Telephone Encounter (Signed)
  Follow up Call-  Call back number 10/22/2019  Post procedure Call Back phone  # [phone]  Permission to leave phone message Yes  Some recent data might be hidden     Patient questions:  Message left to call us if necessary.  Call # 2.

## 2019-10-28 ENCOUNTER — Encounter: Payer: Self-pay | Admitting: Gastroenterology
